# Patient Record
Sex: Female | Born: 1970 | Race: White | Hispanic: No | Marital: Married | Smoking: Never smoker
Health system: Southern US, Community
[De-identification: ages and names within clinical notes are randomized; demographics above are authoritative.]

## PROBLEM LIST (undated history)

## (undated) HISTORY — PX: ABDOMINAL HYSTERECTOMY: SHX81

## (undated) HISTORY — PX: OOPHORECTOMY: SHX86

## (undated) HISTORY — PX: THYROIDECTOMY: SHX17

---

## 2019-02-04 ENCOUNTER — Ambulatory Visit (INDEPENDENT_AMBULATORY_CARE_PROVIDER_SITE_OTHER): Payer: Self-pay

## 2019-02-04 ENCOUNTER — Encounter (HOSPITAL_COMMUNITY): Payer: Self-pay

## 2019-02-04 ENCOUNTER — Other Ambulatory Visit: Payer: Self-pay

## 2019-02-04 ENCOUNTER — Ambulatory Visit (HOSPITAL_COMMUNITY)
Admission: EM | Admit: 2019-02-04 | Discharge: 2019-02-04 | Disposition: A | Discharge status: Home / Self Care | Payer: Self-pay | Attending: Family Medicine | Admitting: Family Medicine

## 2019-02-04 DIAGNOSIS — R0789 Other chest pain: Secondary | ICD-10-CM

## 2019-02-04 NOTE — ED Triage Notes (Signed)
Pt states she states 3 days ago she started having neck pain and middle back, the pain went away last night. This morning pt started having right chest pain. Deep breathing and turning to the side makes the chest pain worse.   Pt report she started developing shortness of breath today when she was going up the stairs. Pt reports the cough just started now.

## 2019-02-04 NOTE — Discharge Instructions (Signed)
Use anti-inflammatories for pain/swelling. You may take up to 800 mg Ibuprofen every 8 hours with food. You may supplement Ibuprofen with Tylenol [phone] mg every 8 hours.   Xray normal  Follow up if not resolving or worsening

## 2019-02-04 NOTE — ED Provider Notes (Signed)
[facility]    CSN: [id number] Arrival date & time: 02/04/19  1756      History   Chief Complaint Chief Complaint  Patient presents with  . Shortness of Breath  . Cough  . Chest Pain    HPI Tiffany Fisher is a 48 y.o. female history of hysterectomy, oophorectomy, thyroidectomy presenting today for evaluation of right-sided chest pain and shortness of breath.  Patient states that over the past 2 to 3 days she has had neck and back pain, this is resolved when she woke up this morning she no longer had this pain, but now has right-sided chest pain that wraps around to her scapular area.  Pain worse with deep breathing as well as movement.  She denies any injury trauma or direct blow to this area.  Denies increase in activity.  Denies symptoms on left side.  Had a cough once today, but this is not been persistent.  Denies congestion or sore throat.  Denies fevers chills or body aches.  She denies history of high blood pressure, diabetes, tobacco use.  Denies previous DVT/PE.  Denies leg pain or leg swelling.  Denies recent travel or immobilization.  Patient is on HRT therapy which does include some estrogen due to her hysterectomy.  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  Has tried taking some ibuprofen for her symptoms.  HPI  History reviewed. No pertinent past medical history.  There are no active problems to display for this patient.   Past Surgical History:  Procedure Laterality Date  . ABDOMINAL HYSTERECTOMY    . OOPHORECTOMY    . THYROIDECTOMY      OB History   No obstetric history on file.      Home Medications    Prior to Admission medications   Medication Sig Start Date End Date Taking? Authorizing Provider  hydrocortisone (CORTEF) 5 MG tablet TAKE 1 TABLET BY MOUTH EVERY MORNING AND 1/2 TABLET AT NOON AS NEEDED 11/22/18   [provider]  liothyronine (CYTOMEL) 25  MCG tablet TAKE 1 TABLET BY MOUTH IN THE MORNING AND 1/2 TABLET EVERY AFTERNOON 12/03/18   [provider]    Family History History reviewed. No pertinent family history.  Social History Social History   Tobacco Use  . Smoking status: Never Smoker  . Smokeless tobacco: Never Used  Substance Use Topics  . Alcohol use: Yes  . Drug use: Never     Allergies   Itraconazole and Metaxalone   Review of Systems Review of Systems  Constitutional: Negative for activity change, appetite change, chills, fatigue and fever.  HENT: Negative for congestion, ear pain, rhinorrhea, sinus pressure, sore throat and trouble swallowing.   Eyes: Negative for discharge and redness.  Respiratory: Positive for shortness of breath. Negative for cough and chest tightness.   Cardiovascular: Positive for chest pain.  Gastrointestinal: Negative for abdominal pain, diarrhea, nausea and vomiting.  Musculoskeletal: Negative for myalgias.  Skin: Negative for rash.  Neurological: Negative for dizziness, light-headedness and headaches.     Physical Exam Triage Vital Signs ED Triage Vitals  Enc Vitals Group     BP 02/04/19 1830 (!) 141/87     Pulse Rate 02/04/19 1830 77     Resp 02/04/19 1830 15     Temp 02/04/19 1830 99.1 F (37.3 C)     Temp Source 02/04/19 1830 Temporal     SpO2 02/04/19 1830 100 %  Weight --      Height --      Head Circumference --      Peak Flow --      Pain Score 02/04/19 1824 6     Pain Loc --      Pain Edu? --      Excl. in GC? --    No data found.  Updated Vital Signs BP (!) 141/87 (BP Location: Left Arm)   Pulse 77   Temp 99.1 F (37.3 C) (Temporal)   Resp 15   SpO2 100%   Visual Acuity Right Eye Distance:   Left Eye Distance:   Bilateral Distance:    Right Eye Near:   Left Eye Near:    Bilateral Near:     Physical Exam Vitals signs and nursing note reviewed.  Constitutional:      General: She is not in acute distress.    Appearance: She  is well-developed.  HENT:     Head: Normocephalic and atraumatic.     Nose:     Comments: Nasal mucosa pink, nonswollen turbinates, no rhinorrhea    Mouth/Throat:     Comments: Oral mucosa pink and moist, no tonsillar enlargement or exudate. Posterior pharynx patent and nonerythematous, no uvula deviation or swelling. Normal phonation.  Eyes:     Extraocular Movements: Extraocular movements intact.     Conjunctiva/sclera: Conjunctivae normal.     Pupils: Pupils are equal, round, and reactive to light.  Neck:     Musculoskeletal: Neck supple.  Cardiovascular:     Rate and Rhythm: Normal rate and regular rhythm.     Heart sounds: No murmur.  Pulmonary:     Effort: Pulmonary effort is normal. No respiratory distress.     Breath sounds: Normal breath sounds.     Comments: Breathing comfortably at rest, CTABL, no wheezing, rales or other adventitious sounds auscultated Abdominal:     Palpations: Abdomen is soft.     Tenderness: There is no abdominal tenderness.  Musculoskeletal:     Comments: Nontender to palpation of cervical, thoracic and lumbar spine midline, no reproducible tenderness to palpation throughout left thoracic musculature  Mild reproducible anterior chest tenderness to right sternal border   Bilateral lower legs without edema, no calf tenderness swelling or erythema  Skin:    General: Skin is warm and dry.  Neurological:     General: No focal deficit present.     Mental Status: She is alert and oriented to person, place, and time.      UC Treatments / Results  Labs (all labs ordered are listed, but only abnormal results are displayed) Labs Reviewed - No data to display  EKG   Radiology Dg Ribs Unilateral W/chest Right  Result Date: 02/04/2019 CLINICAL DATA:  Shortness of breath and RIGHT chest pain for 3 days. EXAM: RIGHT RIBS AND CHEST - 3+ VIEW COMPARISON:  None. FINDINGS: No fracture or other bone lesions are seen involving the ribs. There is no  evidence of pneumothorax or pleural effusion. Both lungs are clear. Heart size and mediastinal contours are within normal limits. IMPRESSION: Negative. Electronically Signed   By: Harmon Pier M.D.   On: 02/04/2019 19:31    Procedures Procedures (including critical care time)  Medications Ordered in UC Medications - No data to display  Initial Impression / Assessment and Plan / UC Course  I have reviewed the triage vital signs and the nursing notes.  Pertinent labs & imaging results that were available during my  care of the patient were reviewed by me and considered in my medical decision making (see chart for details).     Chest discomfort is pleuritic and right-sided, do not suspect cardiac etiology, negative risk factors.  X-rays negative for any signs of rib abnormality or underlying pulmonary cause.  Patient does not have any respiratory symptoms.  Main risk factor for PE is her HRT medicine.  Otherwise vital signs without tachycardia or hypoxia.  No signs of DVT.  At this time will treat as chest wall inflammation and recommend anti-inflammatories with close monitoring of symptoms.Discussed strict return precautions. Patient verbalized understanding and is agreeable with plan.  Final Clinical Impressions(s) / UC Diagnoses   Final diagnoses:  Atypical chest pain     Discharge Instructions     Use anti-inflammatories for pain/swelling. You may take up to 800 mg Ibuprofen every 8 hours with food. You may supplement Ibuprofen with Tylenol [phone] mg every 8 hours.   Xray normal  Follow up if not resolving or worsening    ED Prescriptions    None     PDMP not reviewed this encounter.   , Marydel C, PA-C 02/05/19 1006

## 2020-11-07 IMAGING — DX DG RIBS W/ CHEST 3+V*R*
3 series · 3 of 3 positions shown · non-contrast
Comparison: None.

CLINICAL DATA: Shortness of breath and RIGHT chest pain for 3 days.

EXAM:
RIGHT RIBS AND CHEST - 3+ VIEW

[chest pa]
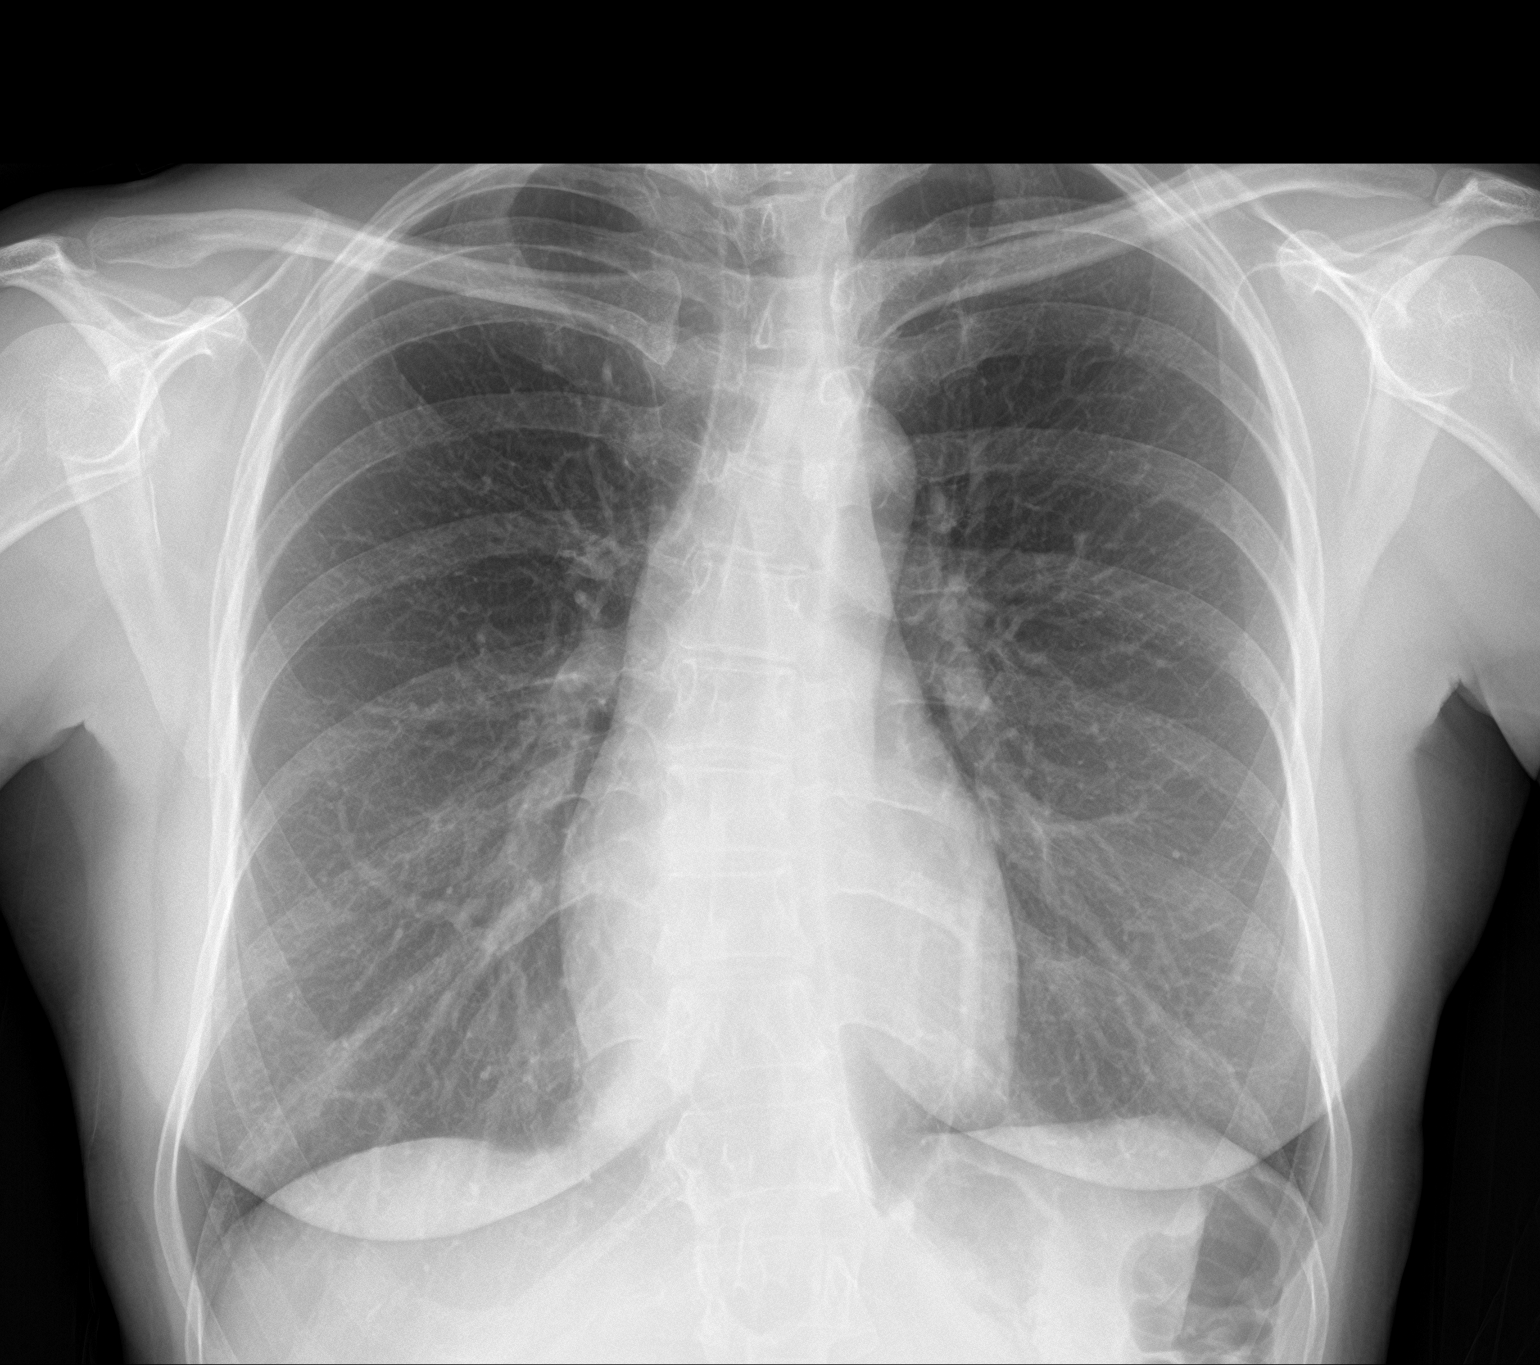

[rib pa]
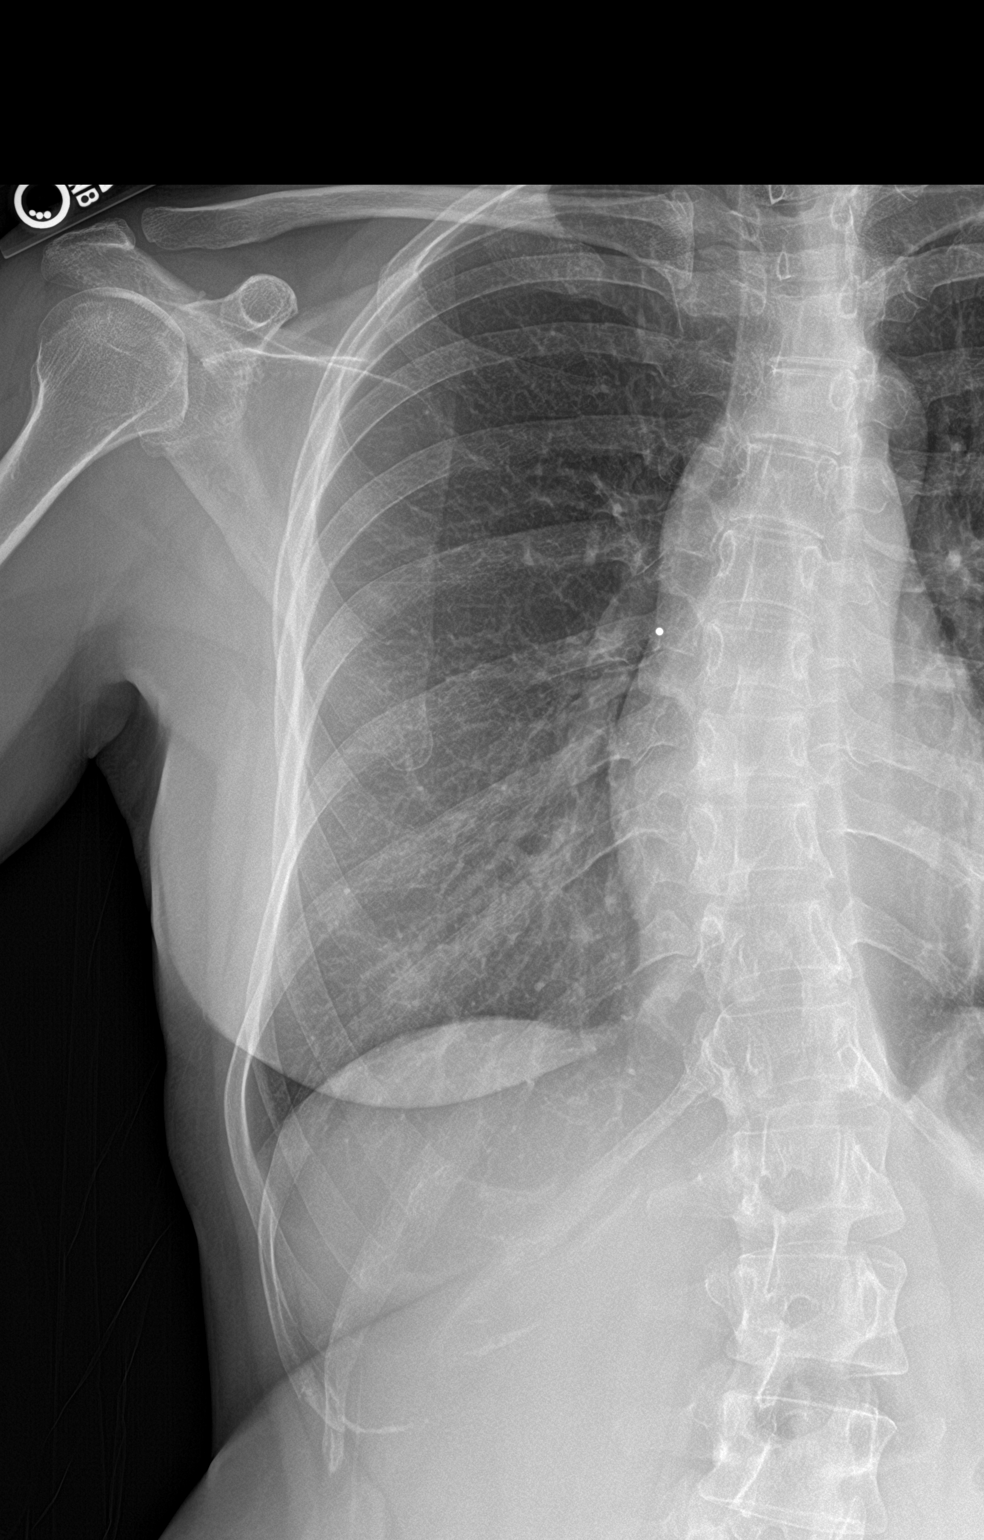

[rib obl]
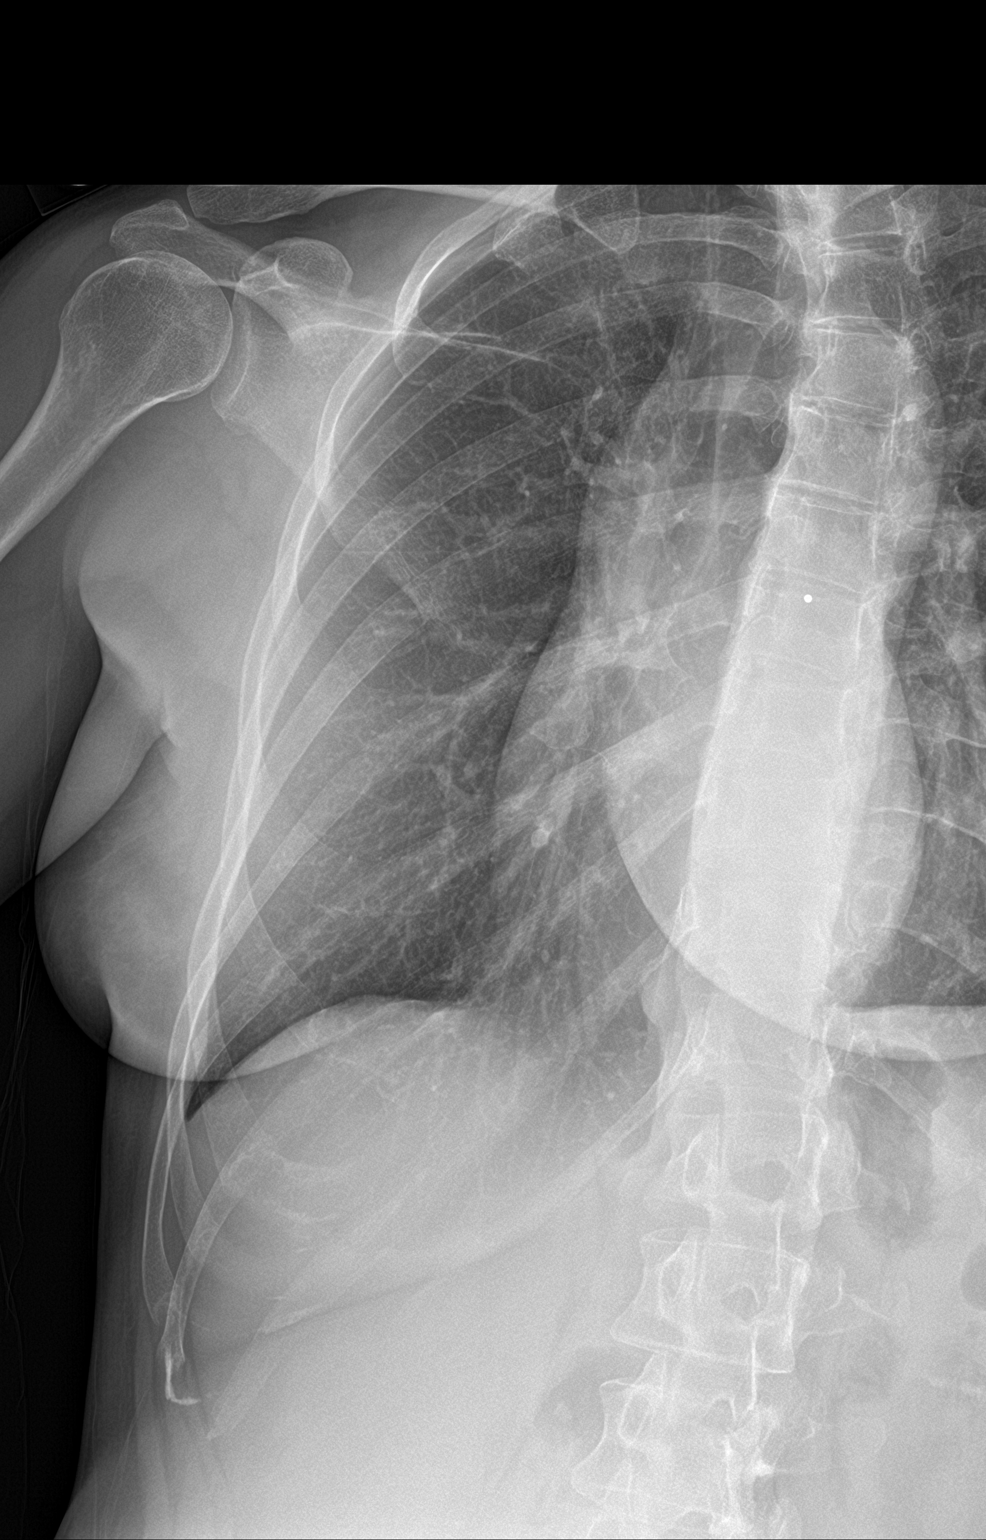

[3 of 3 positions shown; findings below may reference images not displayed]

FINDINGS: No fracture or other bone lesions are seen involving the ribs. There
is no evidence of pneumothorax or pleural effusion. Both lungs are
clear. Heart size and mediastinal contours are within normal limits.
IMPRESSION: Negative.
